# Patient Record
Sex: Male | Born: 1985 | Race: White | Hispanic: No | Marital: Single | Smoking: Current every day smoker
Health system: Southern US, Community
[De-identification: ages and names within clinical notes are randomized; demographics above are authoritative.]

---

## 2019-01-23 ENCOUNTER — Emergency Department
Admission: EM | Admit: 2019-01-23 | Discharge: 2019-01-23 | Disposition: A | Discharge status: Home / Self Care | Payer: Self-pay | Attending: Emergency Medicine | Admitting: Emergency Medicine

## 2019-01-23 ENCOUNTER — Other Ambulatory Visit: Payer: Self-pay

## 2019-01-23 ENCOUNTER — Encounter: Payer: Self-pay | Admitting: *Deleted

## 2019-01-23 ENCOUNTER — Emergency Department: Payer: Self-pay

## 2019-01-23 DIAGNOSIS — Y999 Unspecified external cause status: Secondary | ICD-10-CM | POA: Insufficient documentation

## 2019-01-23 DIAGNOSIS — S61512A Laceration without foreign body of left wrist, initial encounter: Secondary | ICD-10-CM | POA: Insufficient documentation

## 2019-01-23 DIAGNOSIS — Y939 Activity, unspecified: Secondary | ICD-10-CM | POA: Insufficient documentation

## 2019-01-23 DIAGNOSIS — Y929 Unspecified place or not applicable: Secondary | ICD-10-CM | POA: Insufficient documentation

## 2019-01-23 DIAGNOSIS — W312XXA Contact with powered woodworking and forming machines, initial encounter: Secondary | ICD-10-CM | POA: Insufficient documentation

## 2019-01-23 DIAGNOSIS — S61412A Laceration without foreign body of left hand, initial encounter: Secondary | ICD-10-CM | POA: Insufficient documentation

## 2019-01-23 DIAGNOSIS — Z23 Encounter for immunization: Secondary | ICD-10-CM | POA: Insufficient documentation

## 2019-01-23 DIAGNOSIS — F17210 Nicotine dependence, cigarettes, uncomplicated: Secondary | ICD-10-CM | POA: Insufficient documentation

## 2019-01-23 MED ORDER — CEPHALEXIN 500 MG PO CAPS: 500.0000 mg | ORAL_CAPSULE | Freq: Three times a day (TID) | ORAL | 0 refills | Status: AC

## 2019-01-23 MED ORDER — TETANUS-DIPHTH-ACELL PERTUSSIS 5-2.5-18.5 LF-MCG/0.5 IM SUSP
0.5000 mL | Freq: Once | INTRAMUSCULAR | Status: AC
  Administered 2019-01-23: 18:00:00 0.5 mL via INTRAMUSCULAR
  Filled 2019-01-23: qty 0.5

## 2019-01-23 MED ORDER — LIDOCAINE HCL 1 % IJ SOLN
10.0000 mL | Freq: Once | INTRAMUSCULAR | Status: AC
  Administered 2019-01-23: 10 mL
  Filled 2019-01-23: qty 10

## 2019-01-23 NOTE — ED Provider Notes (Signed)
[facility] Emergency Department Provider Note  ____________________________________________  Time seen: Approximately 4:37 PM  I have reviewed the triage vital signs and the nursing notes.   HISTORY  Chief Complaint Laceration    HPI Noah Oconnor is a 33 y.o. male presents to the emergency department with 2 lacerations along the dorsal aspect of the left hand sustained accidentally with a chainsaw.  Patient has had difficulty extending his fingers since injury occurred.  He has also had some numbness around laceration site.  Patient denies similar injuries in the past.  He cannot remember his last tetanus shot.  No other alleviating measures have been attempted.        History reviewed. No pertinent past medical history.  There are no active problems to display for this patient.   History reviewed. No pertinent surgical history.  Prior to Admission medications   Medication Sig Start Date End Date Taking? Authorizing Provider  cephALEXin (KEFLEX) 500 MG capsule Take 1 capsule (500 mg total) by mouth 3 (three) times daily for 7 days. 01/23/19 01/30/19  Orvil FeilWoods, Jaclyn M, PA-C    Allergies Patient has no allergy information on record.  History reviewed. No pertinent family history.  Social History Social History   Tobacco Use  . Smoking status: Current Every Day Smoker    Packs/day: 0.50    Types: Cigarettes  . Smokeless tobacco: Never Used  Substance Use Topics  . Alcohol use: Not Currently    Frequency: Never  . Drug use: Yes    Types: Methamphetamines     Review of Systems  Constitutional: No fever/chills Eyes: No visual changes. No discharge ENT: No upper respiratory complaints. Cardiovascular: no chest pain. Respiratory: no cough. No SOB. Gastrointestinal: No abdominal pain.  No nausea, no vomiting.  No diarrhea.  No constipation. Musculoskeletal: Patient has left hand pain.  Skin: Patient has left hand laceration.  Neurological:  Negative for headaches, focal weakness or numbness.   ____________________________________________   PHYSICAL EXAM:  VITAL SIGNS: ED Triage Vitals [01/23/19 1531]  Enc Vitals Group     BP 134/86     Pulse Rate (!) 114     Resp 16     Temp 98.4 F (36.9 C)     Temp Source Oral     SpO2 98 %     Weight 165 lb (74.8 kg)     Height 5\' 7"  (1.702 m)     Head Circumference      Peak Flow      Pain Score 8     Pain Loc      Pain Edu?      Excl. in GC?      Constitutional: Alert and oriented. Well appearing and in no acute distress. Eyes: Conjunctivae are normal. PERRL. EOMI. Head: Atraumatic. Neck: No stridor.  No cervical spine tenderness to palpation.  Cardiovascular: Normal rate, regular rhythm. Normal S1 and S2.  Good peripheral circulation. Respiratory: Normal respiratory effort without tachypnea or retractions. Lungs CTAB. Good air entry to the bases with no decreased or absent breath sounds. Gastrointestinal: Bowel sounds 4 quadrants. Soft and nontender to palpation. No guarding or rigidity. No palpable masses. No distention. No CVA tenderness. Musculoskeletal: Patient had difficulty extending 2nd, 3rd and 4th digits.  Neurologic:  Normal speech and language. No gross focal neurologic deficits are appreciated.  Skin: Patient has jagged 4 cm laceration along the dorsal aspect of the left hand with tendon exposure.  Tendon appears to be partially lacerated.  There is also a 3 cm linear laceration along the dorsal aspect of the left wrist. Psychiatric: Mood and affect are normal. Speech and behavior are normal. Patient exhibits appropriate insight and judgement.   ____________________________________________   LABS (all labs ordered are listed, but only abnormal results are displayed)  Labs Reviewed - No data to display ____________________________________________  EKG   ____________________________________________  RADIOLOGY I personally viewed and evaluated  these images as part of my medical decision making, as well as reviewing the written report by the radiologist. Dg Hand Complete Left  Result Date: 01/23/2019 CLINICAL DATA:  Left hand pain after injury. EXAM: LEFT HAND - COMPLETE 3+ VIEW COMPARISON:  None. FINDINGS: There is no evidence of fracture or dislocation. There is no evidence of arthropathy or other focal bone abnormality. Laceration is seen between the second and third metacarpals. No radiopaque foreign body is noted. IMPRESSION: Soft tissue laceration is noted. No radiopaque foreign body is noted. No fracture or other bony abnormality is noted. Electronically Signed   By: Marijo Conception M.D.   On: 01/23/2019 16:29    ____________________________________________    PROCEDURES  Procedure(s) performed:    Procedures  LACERATION REPAIR Performed by: Lannie Fields Authorized by: Lannie Fields Consent: Verbal consent obtained. Risks and benefits: risks, benefits and alternatives were discussed Consent given by: patient Patient identity confirmed: provided demographic data Prepped and Draped in normal sterile fashion Wound explored  Laceration Location: Left dorsal hand  Laceration Length: 4 cm  No Foreign Bodies seen or palpated  Anesthesia: local infiltration  Local anesthetic: lidocaine 1% without epinephrine  Anesthetic total: 3 ml  Irrigation method: syringe Amount of cleaning: standard  Skin closure: 5-0 Ethilon   Number of sutures: 12  Technique: Simple Interrupted   Patient tolerance: Patient tolerated the procedure well with no immediate complications.   LACERATION REPAIR Performed by: Lannie Fields Authorized by: Lannie Fields Consent: Verbal consent obtained. Risks and benefits: risks, benefits and alternatives were discussed Consent given by: patient Patient identity confirmed: provided demographic data Prepped and Draped in normal sterile fashion Wound explored  Laceration Location:  Left dorsal wrist   Laceration Length: 3 cm  No Foreign Bodies seen or palpated  Anesthesia: local infiltration  Local anesthetic: lidocaine 1% without epinephrine  Anesthetic total: 3 ml  Irrigation method: syringe Amount of cleaning: standard  Skin closure: 5-0 Ethilon    Number of sutures: 9  Technique: Simple Interrupted   Patient tolerance: Patient tolerated the procedure well with no immediate complications.   Medications  lidocaine (XYLOCAINE) 1 % (with pres) injection 10 mL (10 mLs Infiltration Given by Other 01/23/19 1724)  Tdap (BOOSTRIX) injection 0.5 mL (0.5 mLs Intramuscular Given 01/23/19 1732)     ____________________________________________   INITIAL IMPRESSION / ASSESSMENT AND PLAN / ED COURSE  Pertinent labs & imaging results that were available during my care of the patient were reviewed by me and considered in my medical decision making (see chart for details).  Review of the [place] CSRS was performed in accordance of the [place] prior to dispensing any controlled drugs.           Assessment and Plan: Left hand laceration:  Patient presents to the emergency department with a laceration sustained to the dorsal aspect of the left hand.  Laceration was irrigated with one thousand cc of normal saline with Betadine and laceration repair occurred without complication.  There was tendon exposure with initial injury and I advised that  patient follow-up with Dr. Stephenie AcresSoria.  Patient was discharged with Keflex.  Advised external sutures to be removed in 1 week.  All patient questions were answered.    ____________________________________________  FINAL CLINICAL IMPRESSION(S) / ED DIAGNOSES  Final diagnoses:  Laceration of left hand without foreign body, initial encounter      NEW MEDICATIONS STARTED DURING THIS VISIT:  ED Discharge Orders         Ordered    cephALEXin (KEFLEX) 500 MG capsule  3 times daily     01/23/19 1724              This chart  was dictated using voice recognition software/Dragon. Despite best efforts to proofread, errors can occur which can change the meaning. Any change was purely unintentional.    Orvil FeilWoods, Jaclyn M, PA-C 01/23/19 1743    Sharman CheekStafford, Phillip, MD 01/25/19 [phone]

## 2019-01-23 NOTE — ED Notes (Signed)
Se triage note  States he was working with a chain saw   Slipped and hit the back of his hand

## 2019-01-23 NOTE — ED Triage Notes (Signed)
Pt cut back of left hand with chainsaw. Pt able to move all fingers but is reporting numbness at this time.

## 2019-01-23 NOTE — Discharge Instructions (Signed)
Take Keflex three times daily for the next week.  Please follow up with hand specialist, Dr. Peggye Ley.  Have external sutures removed by seven days.

## 2020-07-22 IMAGING — DX LEFT HAND - COMPLETE 3+ VIEW
3 series · 3 of 3 positions shown · non-contrast
Comparison: None.

CLINICAL DATA: Left hand pain after injury.

EXAM:
LEFT HAND - COMPLETE 3+ VIEW

[hand ap]
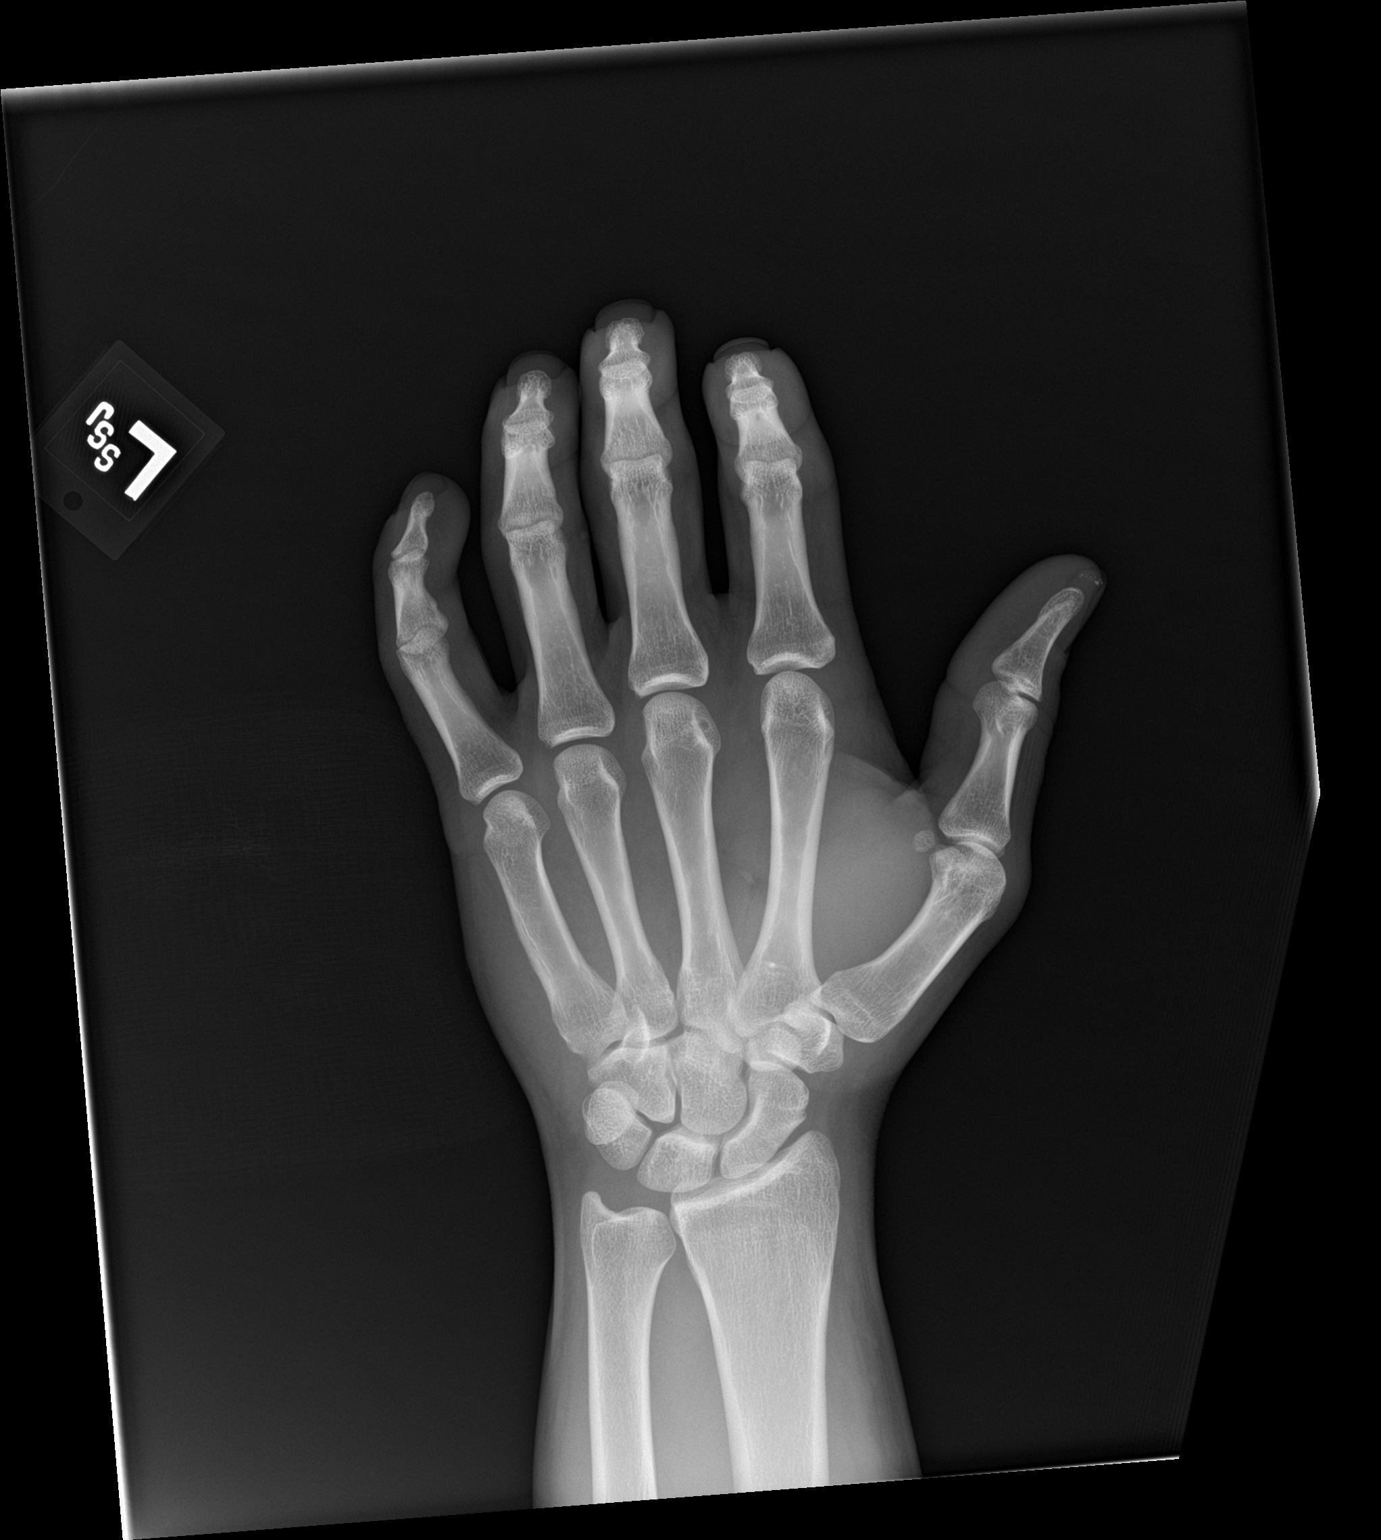

[hand obl]
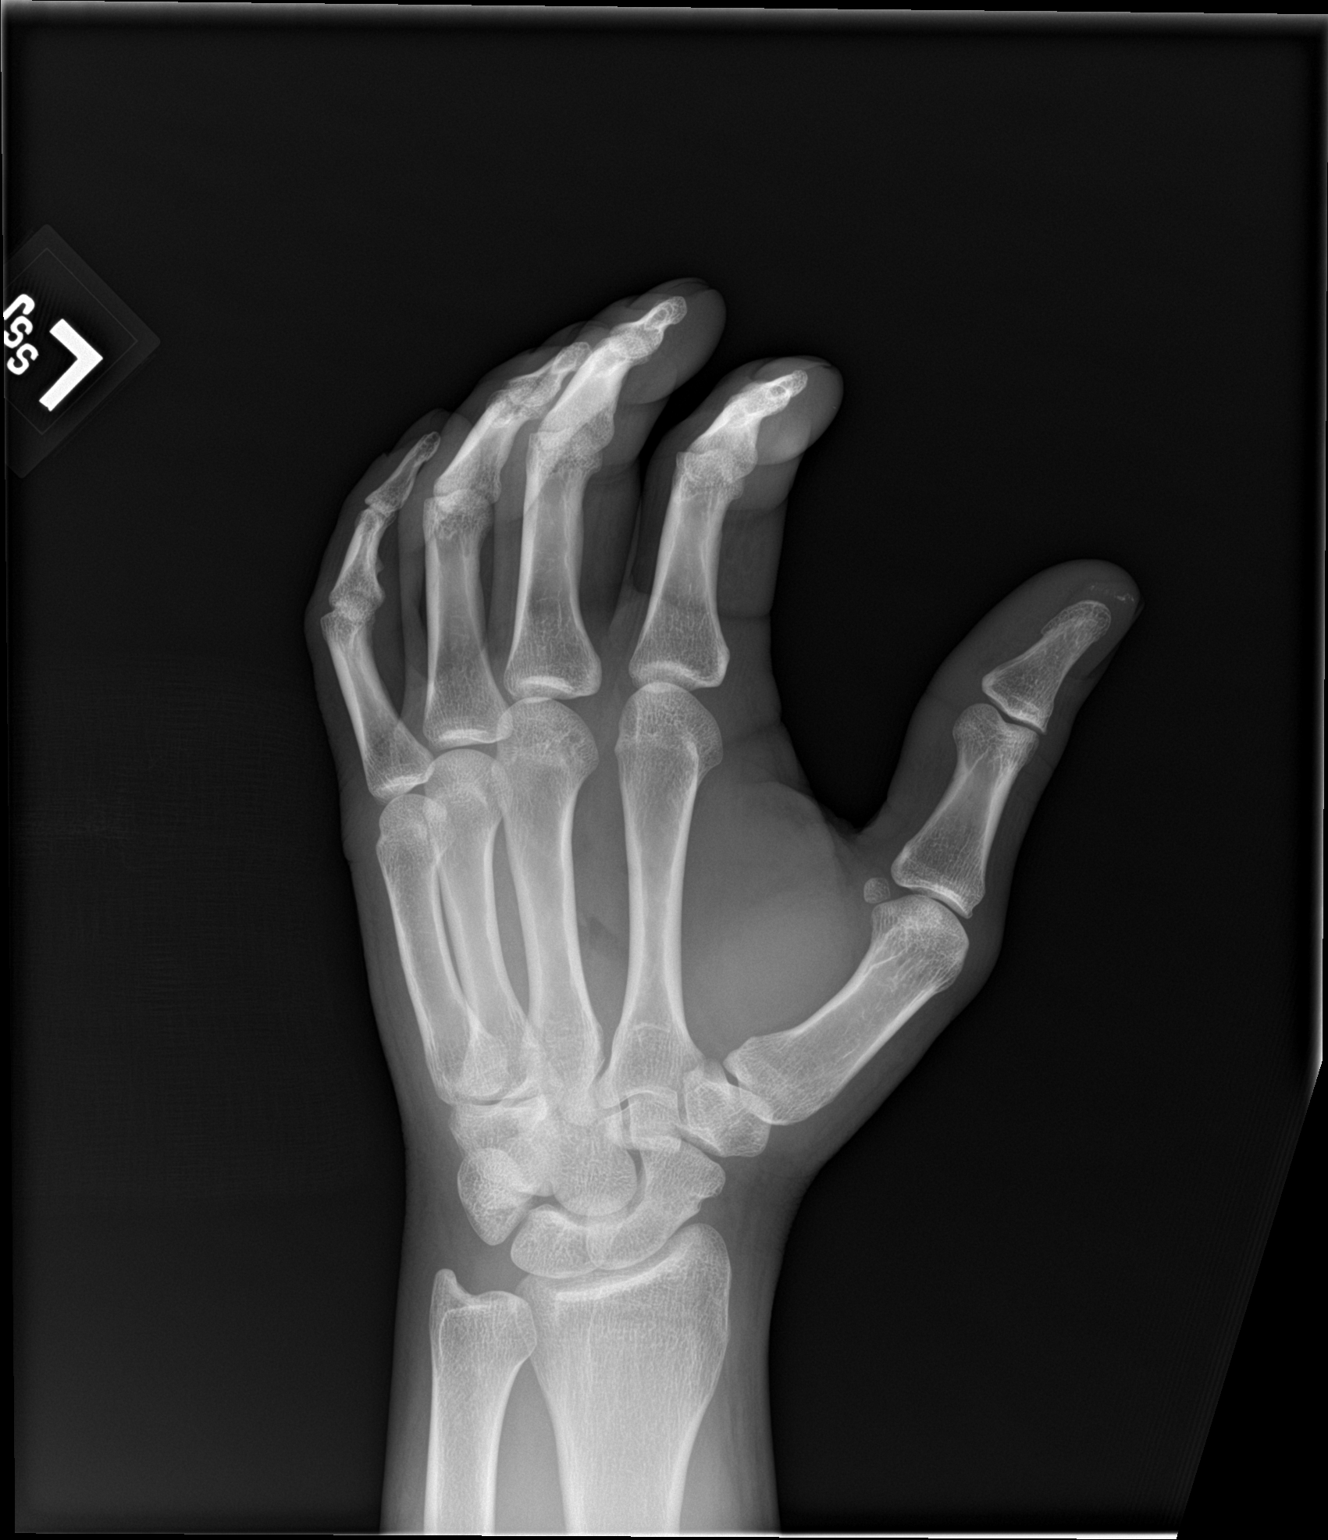

[hand lat]
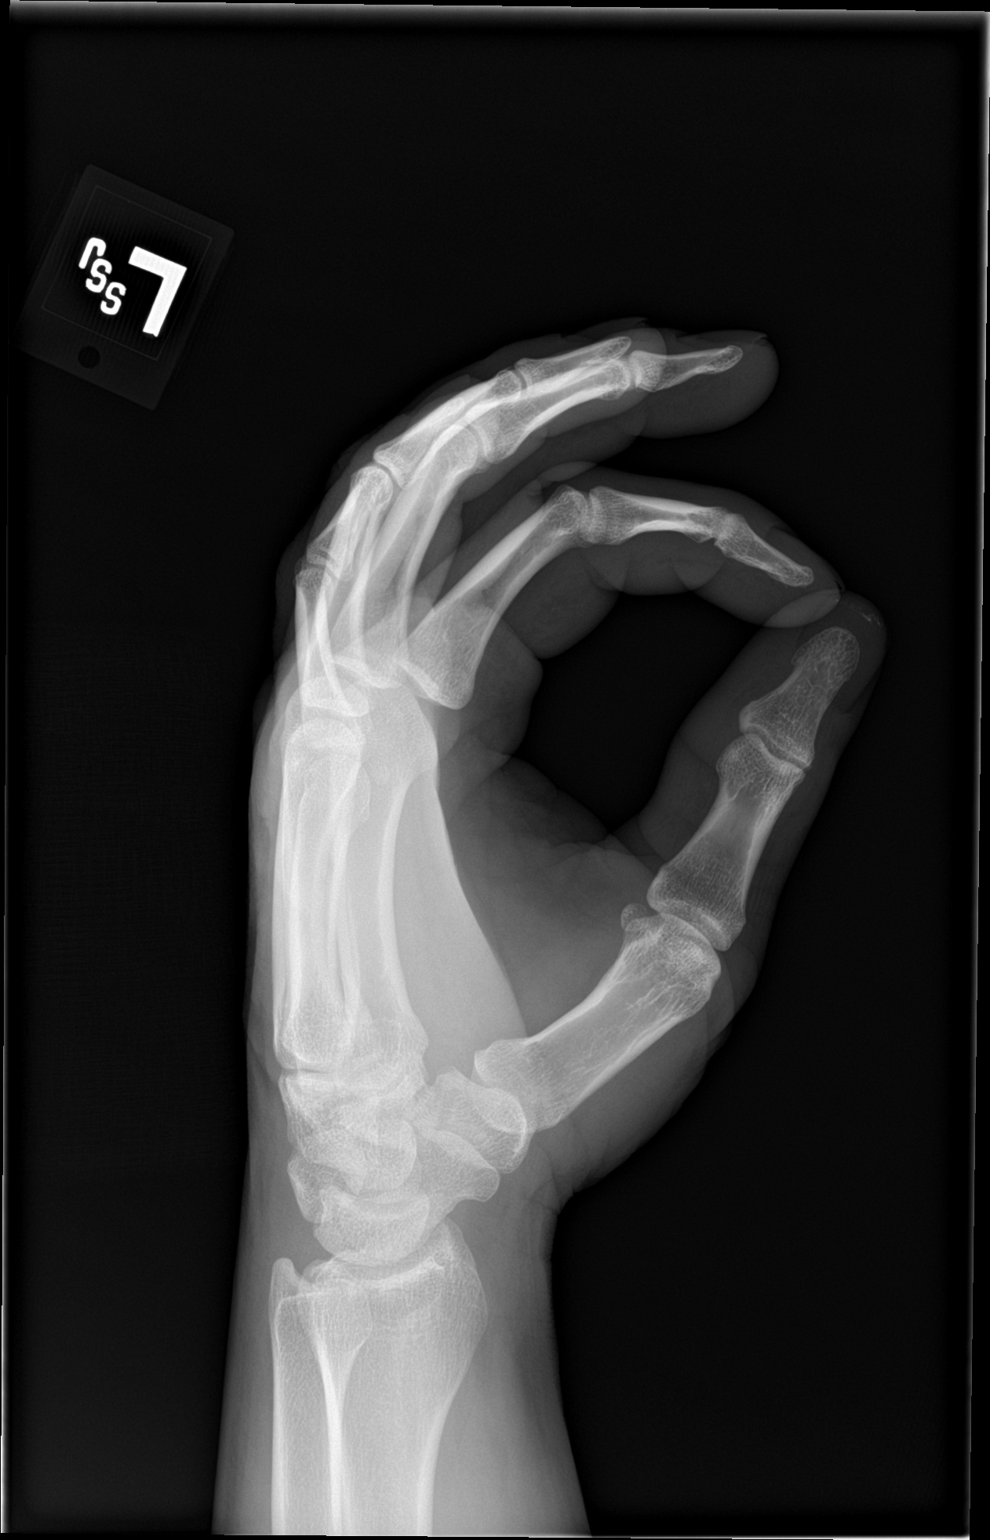

[3 of 3 positions shown; findings below may reference images not displayed]

FINDINGS: There is no evidence of fracture or dislocation. There is no
evidence of arthropathy or other focal bone abnormality. Laceration
is seen between the second and third metacarpals. No radiopaque
foreign body is noted.
IMPRESSION: Soft tissue laceration is noted. No radiopaque foreign body is
noted. No fracture or other bony abnormality is noted.
